# Patient Record
Sex: Male | Born: 1965 | Race: White | Hispanic: No | Marital: Married
Health system: Southern US, Community
[De-identification: ages and names within clinical notes are randomized; demographics above are authoritative.]

## PROBLEM LIST (undated history)

## (undated) HISTORY — PX: SHOULDER FUSION: SUR625

## (undated) HISTORY — PX: MASTOIDECTOMY: SHX711

---

## 2004-05-27 ENCOUNTER — Emergency Department: Payer: Self-pay | Admitting: Internal Medicine

## 2016-09-20 ENCOUNTER — Encounter: Payer: Self-pay | Admitting: *Deleted

## 2016-09-20 ENCOUNTER — Ambulatory Visit
Admission: EM | Admit: 2016-09-20 | Discharge: 2016-09-20 | Disposition: A | Discharge status: Home / Self Care | Payer: Managed Care, Other (non HMO) | Attending: Family Medicine | Admitting: Family Medicine

## 2016-09-20 DIAGNOSIS — M542 Cervicalgia: Secondary | ICD-10-CM | POA: Diagnosis not present

## 2016-09-20 MED ORDER — PREDNISONE 20 MG PO TABS: 20.0000 mg | ORAL_TABLET | Freq: Every day | ORAL | 0 refills | Status: DC

## 2016-09-20 MED ORDER — CYCLOBENZAPRINE HCL 10 MG PO TABS: 10.0000 mg | ORAL_TABLET | Freq: Every day | ORAL | 0 refills | Status: DC

## 2016-09-20 MED ORDER — HYDROCODONE-ACETAMINOPHEN 5-325 MG PO TABS: ORAL_TABLET | ORAL | 0 refills | Status: DC

## 2016-09-20 NOTE — ED Provider Notes (Signed)
[facility]    CSN: [id number] Arrival date & time: 09/20/16  1157     History   Chief Complaint Chief Complaint  Patient presents with  . Neck Injury    HPI Todd Lewis is a 51 y.o. male.   51 yo male with a c/o chronic neck pain which has recently worsened over the last week. States about 12 years ago he injured his neck, but was never evaluated for this. Since then has had intermittent neck pains which he's treated at home, however over the past week the pain has worsened and last Friday (5 days ago) he saw a chiropractor and was told he had degenerative disease. Patient c/o continuing pain on the right neck that radiates down his arm with occasional numbness/tingling down the arm.    The history is provided by the patient.  Neck Injury     History reviewed. No pertinent past medical history.  There are no active problems to display for this patient.   History reviewed. No pertinent surgical history.     Home Medications    Prior to Admission medications   Medication Sig Start Date End Date Taking? Authorizing Provider  cyclobenzaprine (FLEXERIL) 10 MG tablet Take 1 tablet (10 mg total) by mouth at bedtime. 09/20/16   Payton Mccallum, MD  HYDROcodone-acetaminophen (NORCO/VICODIN) 5-325 MG tablet 1-2 tabs po qhs prn 09/20/16   Payton Mccallum, MD  predniSONE (DELTASONE) 20 MG tablet Take 1 tablet (20 mg total) by mouth daily. 09/20/16   Payton Mccallum, MD    Family History History reviewed. No pertinent family history.  Social History Social History  Substance Use Topics  . Smoking status: Never Smoker  . Smokeless tobacco: Never Used  . Alcohol use No     Allergies   Patient has no known allergies.   Review of Systems Review of Systems   Physical Exam Triage Vital Signs ED Triage Vitals  Enc Vitals Group     BP 09/20/16 1241 (!) 155/102     Pulse Rate 09/20/16 1241 67     Resp 09/20/16 1241 16     Temp 09/20/16 1241 98.6 F (37 C)      Temp Source 09/20/16 1241 Oral     SpO2 09/20/16 1241 97 %     Weight 09/20/16 1243 230 lb (104.3 kg)     Height 09/20/16 1243 5\' 11"  (1.803 m)     Head Circumference --      Peak Flow --      Pain Score 09/20/16 1333 5     Pain Loc --      Pain Edu? --      Excl. in GC? --    No data found.   Updated Vital Signs BP (!) 155/102 (BP Location: Left Arm)   Pulse 67   Temp 98.6 F (37 C) (Oral)   Resp 16   Ht 5\' 11"  (1.803 m)   Wt 230 lb (104.3 kg)   SpO2 97%   BMI 32.08 kg/m   Visual Acuity Right Eye Distance:   Left Eye Distance:   Bilateral Distance:    Right Eye Near:   Left Eye Near:    Bilateral Near:     Physical Exam  Constitutional: He is oriented to person, place, and time. He appears well-developed and well-nourished. No distress.  Musculoskeletal:       Cervical back: He exhibits tenderness (over the cervical paraspinous muscles on the right and right trapezius muscle) and spasm.  He exhibits normal range of motion, no bony tenderness, no swelling, no edema, no deformity, no laceration and normal pulse.  Neurological: He is alert and oriented to person, place, and time. He displays normal reflexes. No cranial nerve deficit or sensory deficit. He exhibits normal muscle tone. Coordination normal.  Skin: He is not diaphoretic.  Nursing note and vitals reviewed.    UC Treatments / Results  Labs (all labs ordered are listed, but only abnormal results are displayed) Labs Reviewed - No data to display  EKG  EKG Interpretation None       Radiology No results found.  Procedures Procedures (including critical care time)  Medications Ordered in UC Medications - No data to display   Initial Impression / Assessment and Plan / UC Course  I have reviewed the triage vital signs and the nursing notes.  Pertinent labs & imaging results that were available during my care of the patient were reviewed by me and considered in my medical decision making (see  chart for details).       Final Clinical Impressions(s) / UC Diagnoses   Final diagnoses:  Neck pain    New Prescriptions Discharge Medication List as of 09/20/2016  1:31 PM    START taking these medications   Details  cyclobenzaprine (FLEXERIL) 10 MG tablet Take 1 tablet (10 mg total) by mouth at bedtime., Starting Tue 09/20/2016, Normal    HYDROcodone-acetaminophen (NORCO/VICODIN) 5-325 MG tablet 1-2 tabs po qhs prn, Print    predniSONE (DELTASONE) 20 MG tablet Take 1 tablet (20 mg total) by mouth daily., Starting Tue 09/20/2016, Normal       1. diagnosis reviewed with patient 2. rx as per orders above; reviewed possible side effects, interactions, risks and benefits  3. Recommend supportive treatment with heat/ice, gentle range of motion 4. Follow-up with PCP; discussed with patient if no improvement he may need referral for MRI 5. F/u  prn if symptoms worsen or don't improve   Payton Mccallumrlando Jeymi Hepp, MD 09/20/16 1359

## 2016-09-20 NOTE — ED Triage Notes (Signed)
Pt has had chronic intermittent neck pain which has become worse recently. Intermittent right arm numbness and neck crepitus. Pt seeing chiropractor which referred him here today.

## 2016-09-20 NOTE — Discharge Instructions (Signed)
Tylenol extra strength 2 tabs in the morning and every 8 hours as needed

## 2016-10-03 ENCOUNTER — Encounter: Payer: Self-pay | Admitting: *Deleted

## 2016-10-03 ENCOUNTER — Ambulatory Visit (INDEPENDENT_AMBULATORY_CARE_PROVIDER_SITE_OTHER): Payer: Managed Care, Other (non HMO)

## 2016-10-03 ENCOUNTER — Ambulatory Visit
Admission: EM | Admit: 2016-10-03 | Discharge: 2016-10-03 | Disposition: A | Discharge status: Home / Self Care | Payer: Managed Care, Other (non HMO) | Attending: Emergency Medicine | Admitting: Emergency Medicine

## 2016-10-03 DIAGNOSIS — M542 Cervicalgia: Secondary | ICD-10-CM

## 2016-10-03 DIAGNOSIS — M5412 Radiculopathy, cervical region: Secondary | ICD-10-CM

## 2016-10-03 MED ORDER — DICLOFENAC SODIUM 75 MG PO TBEC: 75.0000 mg | DELAYED_RELEASE_TABLET | Freq: Two times a day (BID) | ORAL | 0 refills | Status: DC

## 2016-10-03 MED ORDER — METHYLPREDNISOLONE 4 MG PO TBPK: ORAL_TABLET | ORAL | 0 refills | Status: DC

## 2016-10-03 MED ORDER — TIZANIDINE HCL 4 MG PO TABS: 4.0000 mg | ORAL_TABLET | Freq: Three times a day (TID) | ORAL | 0 refills | Status: DC | PRN

## 2016-10-03 NOTE — ED Provider Notes (Signed)
HPI  SUBJECTIVE:  Todd RouxRobert Awbrey is a 51 y.o. male who presents with right arm numbness, pins and needles paresthesias along the lateral posterior aspect of his arm radiating to his middle, index finger and thumb for the past 3 months. Patient states he has a painful knot underneath his shoulder blade. States the pain started in his mid neck, and this has gotten slightly better and is more located along the trapezius. Patient states he had plain films done in his chiropractor 2 weeks ago and showed degenerative changes C5-6 and 7. He has tried acupuncture, chiropractic therapy, Flexeril, Norco, aspirin, Aleve , and a sling. Symptoms are better with prednisone, lateral bending his neck to the left, and with a sling, worse with lateral bending his head to the right and with shoulder flexion and extension. Patient denies limitation of motion of the shoulder, elbow, wrist, hand, grip weakness, color or temperature changes. He states the pain is worse at night. No unintentional weight loss, fevers, thoracic or back pain. He has past medical history of right neck injury "years ago". He denies any recent trauma. 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 No history of diabetes, hypertension, cancer, HIV, IVDU, GI bleed, kidney disease. PMD: None.  Patient was seen at this clinic on 1/30 for the same, found to have right-sided trapezius and cervical paraspinous tenderness and muscle spasm, thought to have musculoskeletal cause of his symptoms and was sent home with Flexeril, Norco and prednisone 20 mg qd x 7 days.     History reviewed. No pertinent past medical history.  History reviewed. No pertinent surgical history.  History reviewed. No pertinent family history.  Social History  Substance Use Topics  . Smoking status: Never Smoker  . Smokeless tobacco: Never Used  . Alcohol use No    No current facility-administered medications for this encounter.    Current Outpatient Prescriptions:  .  diclofenac (VOLTAREN) 75 MG EC tablet, Take 1 tablet (75 mg total) by mouth 2 (two) times daily. Take with food, Disp: 30 tablet, Rfl: 0 .  methylPREDNISolone (MEDROL DOSEPAK) 4 MG TBPK tablet, follow package directions, Disp: 21 tablet, Rfl: 0 .  tiZANidine (ZANAFLEX) 4 MG tablet, Take 1 tablet (4 mg total) by mouth every 8 (eight) hours as needed for muscle spasms., Disp: 30 tablet, Rfl: 0  No Known Allergies   ROS  As noted in HPI.   Physical Exam  BP (!) 169/106 (BP Location: Left Arm)   Pulse 71   Temp 98.1 F (36.7 C) (Oral)   Resp 16   Ht 5\' 11"  (1.803 m)   Wt 230 lb (104.3 kg)   SpO2 98%   BMI 32.08 kg/m   Constitutional: Well developed, well nourished, no acute distress Eyes:  EOMI, conjunctiva normal bilaterally HENT: Normocephalic, atraumatic,mucus membranes moist Respiratory: Normal inspiratory effort Cardiovascular: Normal rate GI: nondistended skin: No rash, skin intact Musculoskeletal: Positive tenderness, muscle spasm right trapezius. No C-spine, T-spine tenderness. No other tenderness over the entire shoulder joint. Patient has full range of active motion in his shoulder, elbow, wrist. Paresthesias aggravated with NT can test and abduction past 90, but patient has no weakness with these tests. Lift off test negative. Grip strength 5/5 and equal bilaterally. He is neurovascularly intact to light touch, temperature and the median/radial/ulnar distribution. Neurologic: Alert & oriented x 3, no focal neuro deficits Psychiatric: Speech and behavior appropriate   ED Course   Medications - No data to display  Orders Placed This Encounter  Procedures  . DG Cervical Spine Complete    Standing Status:   Standing    Number of Occurrences:   1    Order Specific Question:   Reason for Exam (SYMPTOM  OR DIAGNOSIS REQUIRED)    Answer:   R neck shoudler pain, numbnes in middle, index and thumb r/o acute changes    No  results found for this or any previous visit (from the past 24 hour(s)). Dg Cervical Spine Complete  Result Date: 10/03/2016 CLINICAL DATA:  Pt here for numbness in right arm and pain in post midline lower neck and radiates into right shoulder. Pt injured neck 10 years ago in an accident when he was unloading cherry picker off back of truck. He was standing in basket of cherry picker as it was coming off truck and basket was not high enough so it slammed into the ground jarring his whole body. Pt has been self manipulating neck for years since to make his pain go away but has been worse x 3 months now. Some relief with acupuncture but still having pain and numbness. Degenerative changes in lower C-spine noted by chiropractor. EXAM: CERVICAL SPINE - COMPLETE 4+ VIEW COMPARISON:  None. FINDINGS: There is loss of cervical lordosis. This may be secondary to splinting, soft tissue injury, or positioning. There is disc height loss and osteophyte formation at C4-5 and C5-6. No acute fracture or traumatic subluxation. Prevertebral soft tissues have a normal appearance. Lung apices are clear. IMPRESSION: 1.  No evidence for acute  abnormality. 2. Mid cervical spondylosis. 3. Loss of cervical lordosis. Electronically Signed   By: Norva Pavlov M.D.   On: 10/03/2016 12:42    ED Clinical Impression  Cervical radiculopathy  Neck pain   ED Assessment/Plan  Previous records reviewed as noted in history of present illness.  [facility] narcotic database reviewed. 1 narcotic prescription Norco 5/325#10 dispensed 1/30 which was when he was seen here at this clinic.  Obtaining plain films today as he has no records of these being done in the system.  Imaging independently. Disc height loss and osteophyte C4-C5 and C5-C6. No acute fractures or subluxations. See radiology report for details.  Plan to send home on a Medrol Dosepak, start some diclofenac as he is not on any NSAIDs, discontinue Flexeril and try  Zanaflex instead. Patient states that Norco does not help any whatsoever so we'll not refill this. We'll provide referral to Dr. Adriana Simas, neurosurgeon on call and also to Dr. Martha Clan, orthopedic surgeon on call. Advised patient to try both physicians. Discussed with patient that his next step is an MRI of his C-spine. We'll also provide a primary care referral.  Discussed imaging, MDM, plan and followup with patient. Discussed sn/sx that should prompt return to the ED. Patient agrees with plan.   Meds ordered this encounter  Medications  . diclofenac (VOLTAREN) 75 MG EC tablet    Sig: Take 1 tablet (75 mg total) by mouth 2 (two) times daily. Take with food    Dispense:  30 tablet    Refill:  0  . tiZANidine (ZANAFLEX) 4 MG tablet    Sig: Take 1 tablet (4 mg total) by mouth every 8 (eight) hours as needed for muscle spasms.    Dispense:  30 tablet    Refill:  0  . methylPREDNISolone (MEDROL DOSEPAK) 4 MG TBPK tablet    Sig: follow package directions    Dispense:  21 tablet  Refill:  0    *This clinic note was created using Scientist, clinical (histocompatibility and immunogenetics). Therefore, there may be occasional mistakes despite careful proofreading.  ?   Domenick Gong, MD 10/03/16 1729

## 2016-10-03 NOTE — ED Triage Notes (Signed)
Recent hx of right neck, shoulder, and arm pain and paresthesias. Seen here recently. Pt returns today with increasing right neck, shoulder, and arm pain. States much worse today.

## 2016-10-03 NOTE — Discharge Instructions (Signed)
Follow-up with Dr. Adriana Simasook, neurosurgery or with Dr. Martha ClanKrasinski, orthopedic surgeon, whoever you can see first. Take the medicine as written. Go to the ER for arm weakness, worsening of your symptoms, fevers above 100.4 or for other concerns

## 2017-09-06 IMAGING — CR DG CERVICAL SPINE COMPLETE 4+V
7 series · 9 of 9 positions shown · non-contrast
Comparison: None.

CLINICAL DATA: Pt here for numbness in right arm and pain in post
midline lower neck and radiates into right shoulder. Pt injured neck
10 years ago in an accident when he was unloading [employer] off
back of truck. He was standing in basket of [employer] as it was
coming off truck and basket was not high enough so it slammed into
the ground jarring his whole body. Pt has been self manipulating
neck for years since to make his pain go away but has been worse x 3
months now. Some relief with acupuncture but still having pain and
numbness. Degenerative changes in lower C-spine noted by
chiropractor.

EXAM:
CERVICAL SPINE - COMPLETE 4+ VIEW

[c-spine lat]
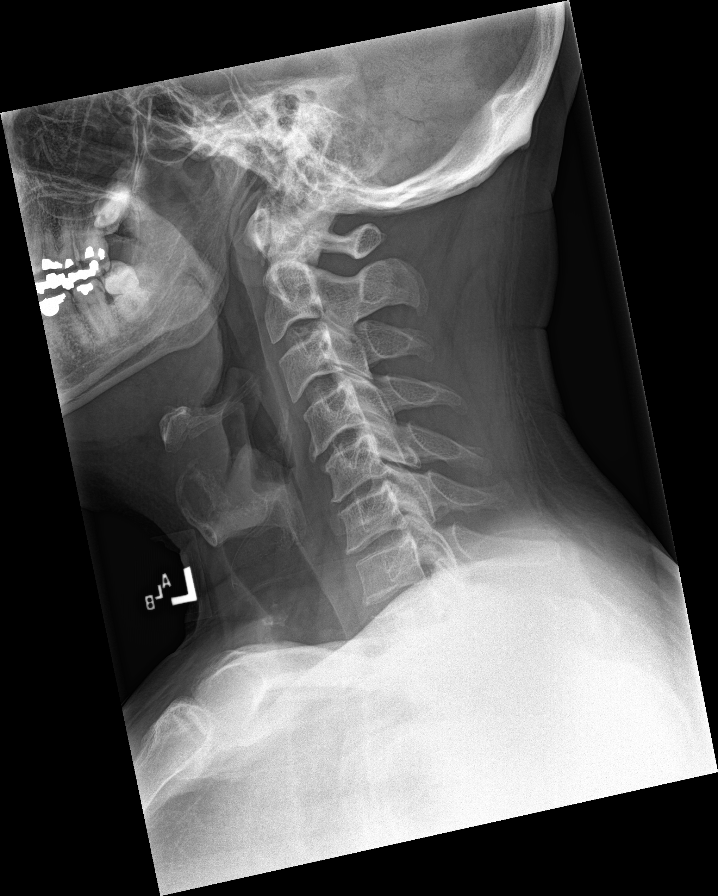

[Series 2: c-spine obl · 0.14mm/px · 2 of 2 slices shown (1 of 2)]
[im 1/2]
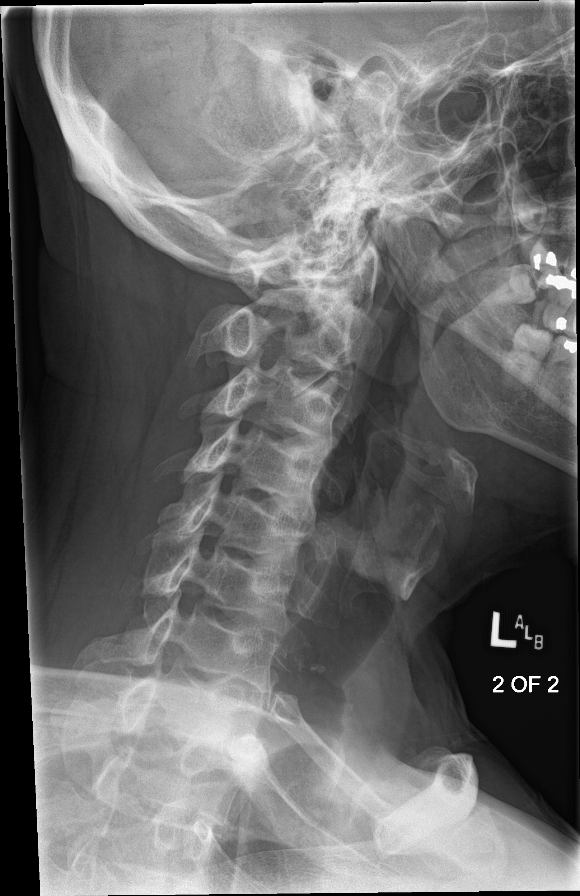
[im 2/2]
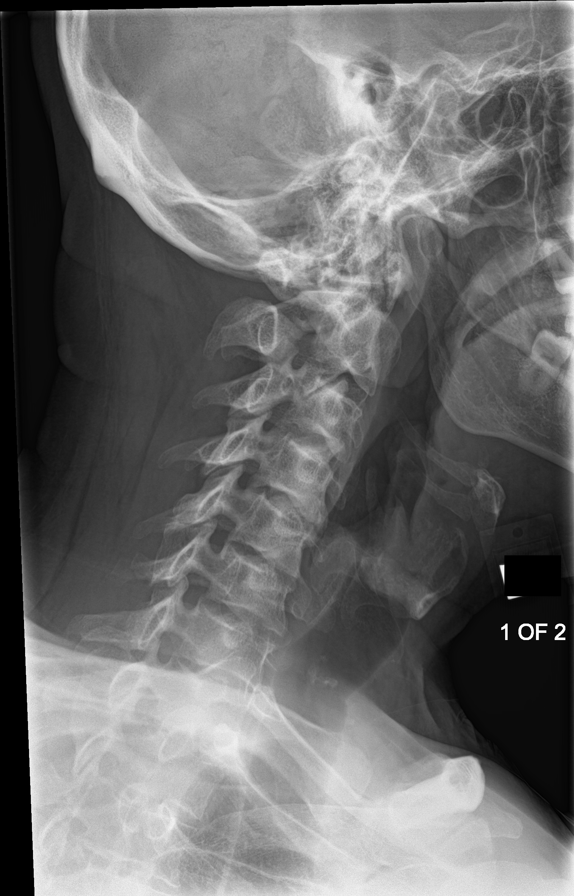

[c-spine obl (2 of 2)]
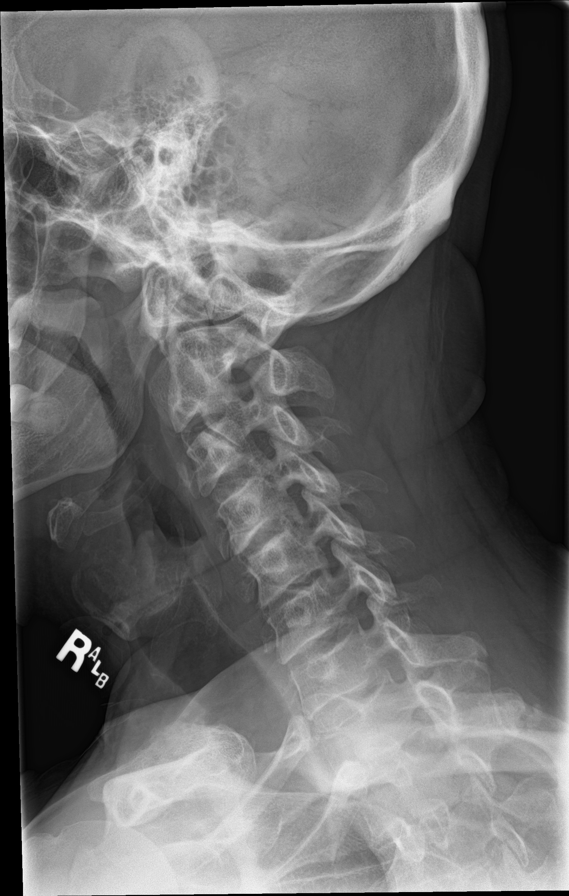

[c-spine ap]
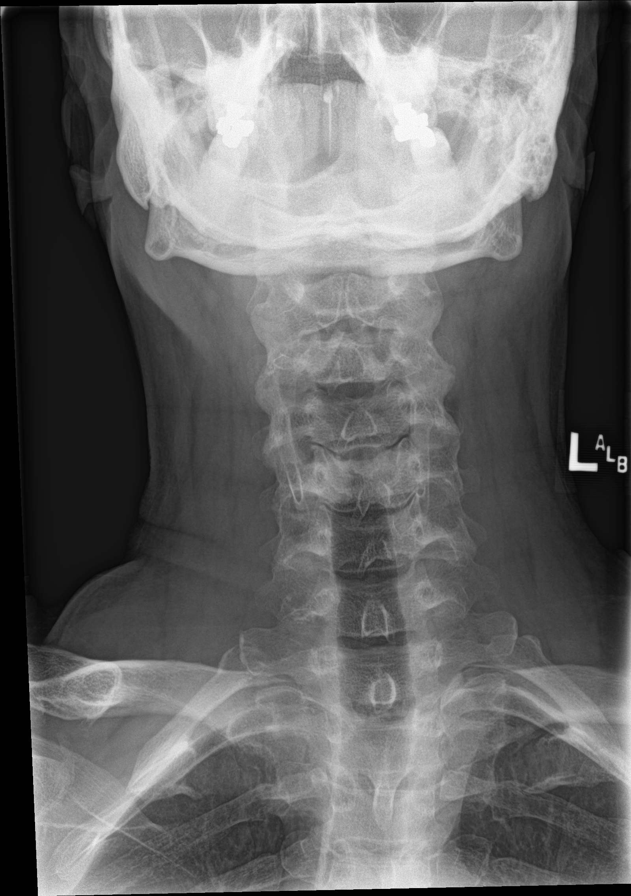

[Series 5: c-spine open mouth · 0.14mm/px · 2 of 2 slices shown]
[im 1/2]
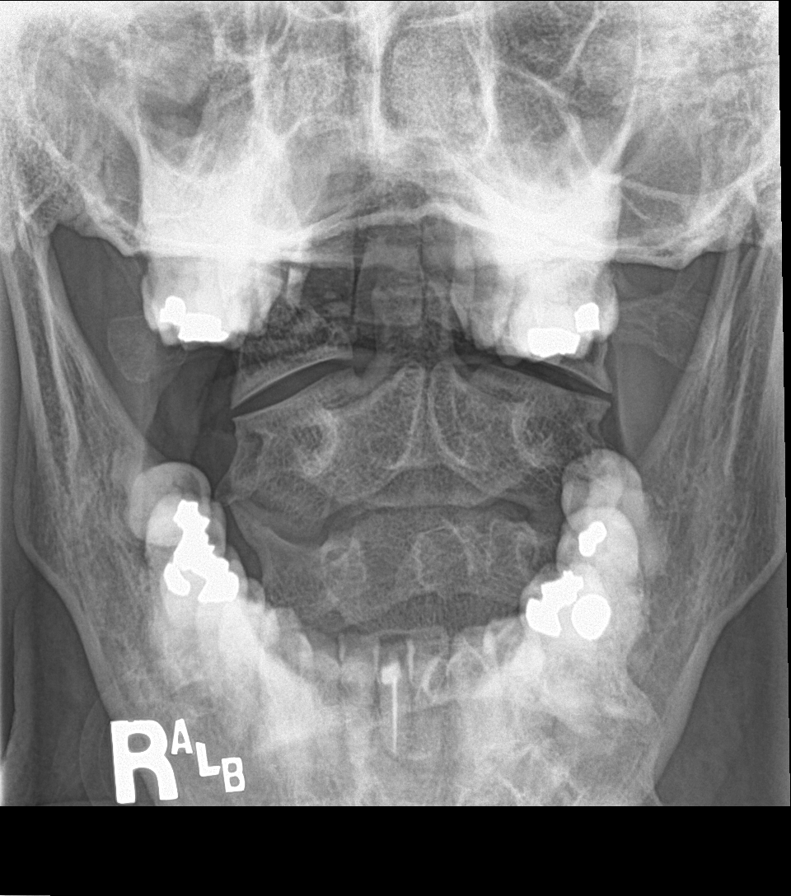
[im 2/2]
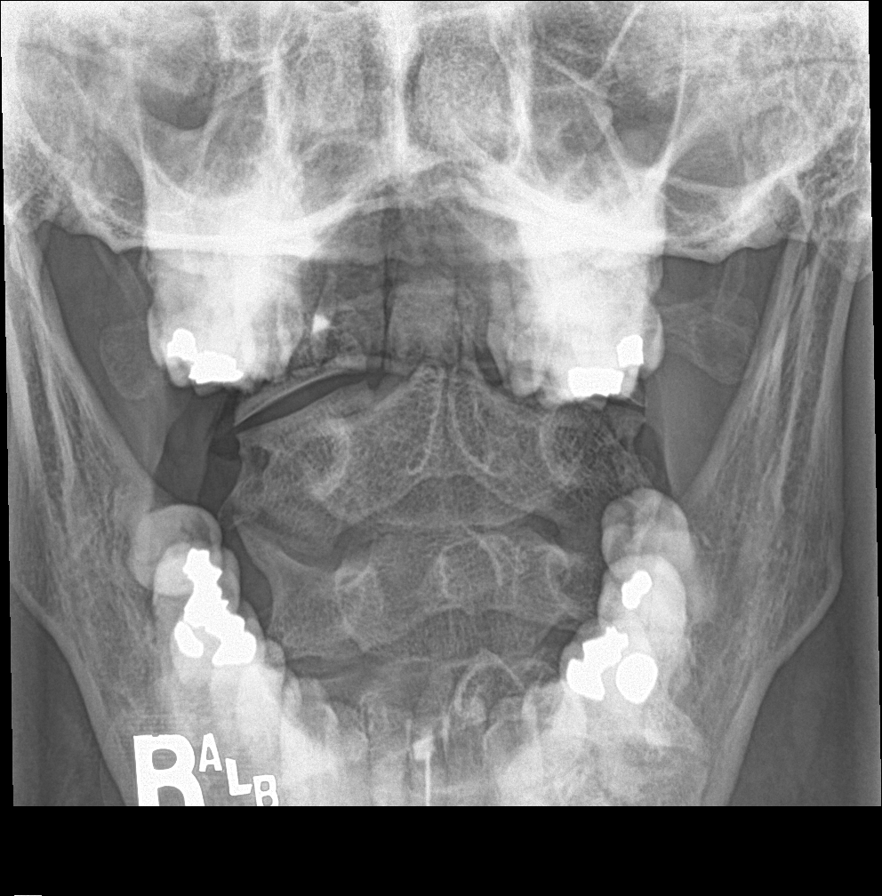

[[person_name]]
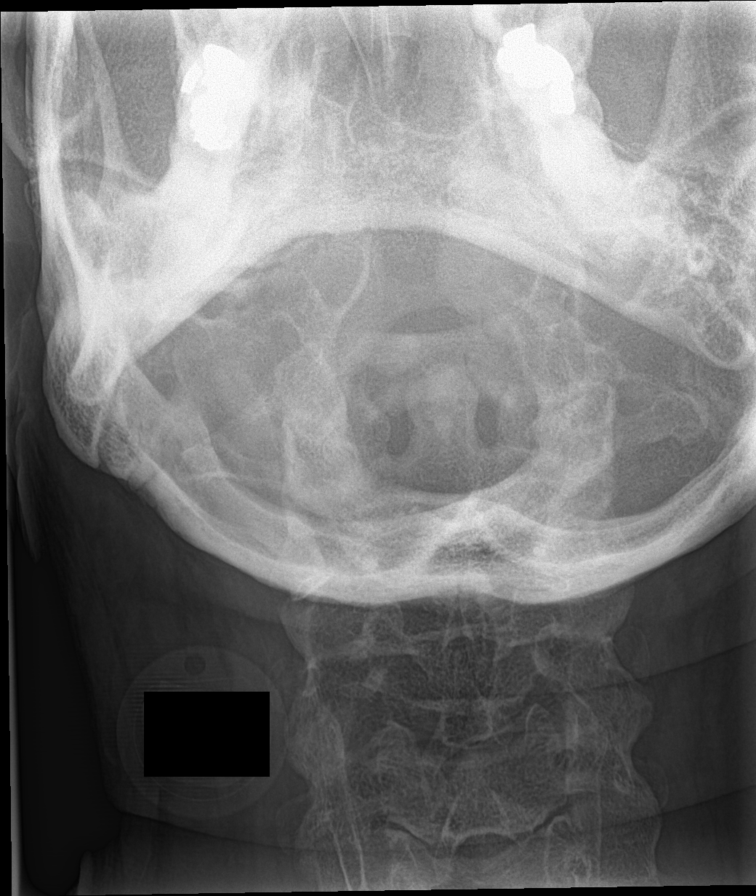

[ct-spine swimmers]
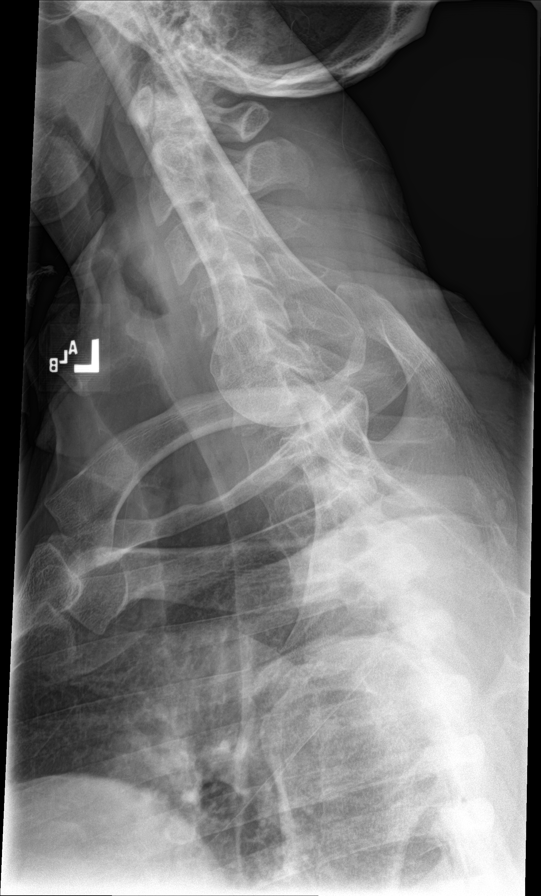

[9 of 9 positions shown; findings below may reference images not displayed]

FINDINGS: There is loss of cervical lordosis. This may be secondary to
splinting, soft tissue injury, or positioning. There is disc height
loss and osteophyte formation at C4-5 and C5-6. No acute fracture or
traumatic subluxation. Prevertebral soft tissues have a normal
appearance. Lung apices are clear.
IMPRESSION: 1.  No evidence for acute  abnormality.
2. Mid cervical spondylosis.
3. Loss of cervical lordosis.

## 2020-08-17 ENCOUNTER — Other Ambulatory Visit: Payer: Self-pay

## 2020-08-17 DIAGNOSIS — Z20822 Contact with and (suspected) exposure to covid-19: Secondary | ICD-10-CM

## 2020-08-18 LAB — NOVEL CORONAVIRUS, NAA: SARS-CoV-2, NAA: NOT DETECTED

## 2020-08-18 LAB — SARS-COV-2, NAA 2 DAY TAT

## 2024-05-31 ENCOUNTER — Ambulatory Visit (INDEPENDENT_AMBULATORY_CARE_PROVIDER_SITE_OTHER): Payer: Self-pay

## 2024-05-31 VITALS — BP 178/108 | HR 69 | Ht 71.0 in | Wt 230.8 lb

## 2024-05-31 DIAGNOSIS — G8929 Other chronic pain: Secondary | ICD-10-CM

## 2024-05-31 DIAGNOSIS — I10 Essential (primary) hypertension: Secondary | ICD-10-CM | POA: Insufficient documentation

## 2024-05-31 DIAGNOSIS — F1290 Cannabis use, unspecified, uncomplicated: Secondary | ICD-10-CM | POA: Diagnosis not present

## 2024-05-31 DIAGNOSIS — M545 Low back pain, unspecified: Secondary | ICD-10-CM

## 2024-05-31 DIAGNOSIS — Z0000 Encounter for general adult medical examination without abnormal findings: Secondary | ICD-10-CM | POA: Diagnosis not present

## 2024-05-31 DIAGNOSIS — K439 Ventral hernia without obstruction or gangrene: Secondary | ICD-10-CM | POA: Insufficient documentation

## 2024-05-31 DIAGNOSIS — M549 Dorsalgia, unspecified: Secondary | ICD-10-CM | POA: Insufficient documentation

## 2024-05-31 DIAGNOSIS — I158 Other secondary hypertension: Secondary | ICD-10-CM

## 2024-05-31 MED ORDER — CYCLOBENZAPRINE HCL 5 MG PO TABS: 5.0000 mg | ORAL_TABLET | Freq: Every day | ORAL | 1 refills | Status: AC

## 2024-05-31 NOTE — Progress Notes (Signed)
 New Patient Visit   Physician: Tyshon Fanning A Doug Bucklin, MD  Patient: Todd Lewis   DOB: 1966-05-31   58 y.o. Male  MRN: [id number] Visit Date: 05/31/2024   Chief Complaint  Patient presents with   Establish Care   Subjective  Todd Lewis is a 58 y.o. male who presents today as a new patient to establish care.   HPI  Discussed the use of AI scribe software for clinical note transcription with the patient, who gave verbal consent to proceed.  History of Present Illness   Todd Lewis is a 58 year old male who presents with a midline hernia and back pain.  Abdominal wall hernia - Midline hernia present for a number of years, painful at times - Hernia has been enlarging over time  Low back pain - Lower back pain present for the past couple of weeks - Pain is most severe in the mornings upon waking and he is unable to move in the mornings - Pain radiates across the lower back, overall severe - Pressure to specific points provides some relief - Aleve taken for pain, but decreasing effectiveness  Blood pressure elevation in clinical settings - Elevated blood pressure readings in medical settings - Blood pressure normalizes after a period of rest - No home blood pressure monitoring - No recent blood work - No chest pain or shortness of breath with exertion.   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   Encounter for general health examination  Yes    Orders Placed This Encounter  Procedures   CBC with Differential/Platelet   Comprehensive metabolic panel with GFR   Hemoglobin A1c   Lipid panel   Urinalysis, Routine w reflex microscopic   PSA   Lipoprotein A (LPA)    Assessment and Plan    Ventral hernia Enlarging midline ventral hernia with occasional pain and disfigurement, indicating need for surgical evaluation. - Refer to general surgery for evaluation and management.  Low back pain with possible lumbar radiculopathy Possible lumbar radiculopathy due to suspected disc herniation or vertebral issues. - Order lumbar spine x-ray to assess vertebrae and disc status. - Prescribe cyclobenzaprine  at bedtime to alleviate morning stiffness and pain.  Elevated blood pressure, possible hypertension Elevated blood pressure noted, possibly due to white coat syndrome. - Advise obtaining a home blood pressure cuff and recording ten values to assess true blood pressure status.  Cannabis use, daily Daily cannabis use for relaxation. Discussed potential lung and brain health risks associated with smoking cannabis.  General Health Maintenance No recent blood work or health screenings. Discussed importance of regular health maintenance screenings. - Order comprehensive blood work including kidney function, liver function, PSA, and diabetes screening. - Advise obtaining routine health screenings annually.     Thoracolumbar back pain Exam unremarkable but severeity high in regard to morning pain  - Will obtain thoracic/lumbar x-ray for initial imaging  - Cyclobenzaprine  at bedtime prn  F/u in 3-4 weeks to review labs    Objective  BP (!) 178/108   Pulse 69   Ht 5' 11 (1.803 m)   Wt 230 lb 12.8 oz (104.7 kg)   SpO2 96%   BMI 32.19 kg/m      Review of Systems  Constitutional:  Negative for chills, fever and weight loss.  Eyes:  Negative for blurred vision. h Respiratory:  Negative for cough and shortness of breath.    Cardiovascular:  Negative for chest pain and palpitations.  Skin:  Negative for rash.  Psychiatric/Behavioral:  Negative for depression. The patient is not nervous/anxious.      Physical Exam Neurological:     Gait: Gait is intact.    Physical Exam Vitals reviewed.  Constitutional:      Appearance: Normal appearance. Well-developed with normal weight.  HENT:     Head: Normocephalic and atraumatic.  Normal mucous membranes, no oral lesions Eyes:     Pupils: Pupils are equal, round, and reactive to light.  Neck:     Thyroid: No thyroid mass or thyromegaly.  Cardiovascular:     Rate and Rhythm: Normal rate and regular rhythm. Normal heart sounds. Normal peripheral pulses Pulmonary:     Normal breath sounds with normal effort Abdominal:   Abdomen is soft, without tenderness or noted hepatosplenomegaly.  Ventral hernia, soft, reducible, 5 x 4 inches roughly Musculoskeletal:        General: No swelling or edema. Spine Musculoskeletal Exam  Gait   Gait is normal.  Inspection   Thoracolumbar   Thoracolumbar inspection is normal.  Palpation   Thoracolumbar   Tenderness: none   Right     Masses: none   Left     Masses: none   Lymphadenopathy:     Cervical: No cervical adenopathy.  Skin:    General: Skin is warm and dry without noticeable rash. Neurological:     General: No focal deficit present.  Psychiatric:        Mood and Affect: Mood, behavior and cognition normal   No past medical history on file. No past surgical history on file. No family status information on file.   No family history on file. Social History   Socioeconomic History   Marital status: Married    Spouse name: Not on file   Number of children: Not on file   Years of education: Not on file   Highest education level: GED or equivalent  Occupational History   Not on file  Tobacco Use   Smoking status: Not on file   Smokeless tobacco: Never  Substance and Sexual Activity   Alcohol use:  No   Drug use: Not on file   Sexual activity: Not on file  Other Topics Concern   Not on file  Social History Narrative   ** Merged History Encounter **       Social Drivers of Health   Financial Resource Strain: Low Risk  (05/27/2024)   Overall Financial Resource Strain (CARDIA)    Difficulty of Paying Living Expenses: Not hard at all  Food Insecurity: No Food Insecurity (05/27/2024)   Hunger Vital Sign    Worried About Running Out of Food in the Last Year: Never true    Ran Out of Food in the Last Year: Never true  Transportation Needs: No Transportation Needs (05/27/2024)   PRAPARE - Administrator, Civil Service (Medical): No    Lack of Transportation (Non-Medical): No  Physical Activity: Sufficiently Active (05/27/2024)   Exercise Vital Sign    Days of  Exercise per Week: 3 days    Minutes of Exercise per Session: 60 min  Stress: No Stress Concern Present (05/27/2024)   [employer] of Occupational Health - Occupational Stress Questionnaire    Feeling of Stress: Not at all  Social Connections: Moderately Isolated (05/27/2024)   Social Connection and Isolation Panel    Frequency of Communication with Friends and Family: More than three times a week    Frequency of Social Gatherings with Friends and Family: Once a week    Attends Religious Services: Never    Database administrator or Organizations: No    Attends Engineer, structural: Not on file    Marital Status: Married   Outpatient Medications Prior to Visit  Medication Sig   diclofenac  (VOLTAREN ) 75 MG EC tablet Take 1 tablet (75 mg total) by mouth 2 (two) times daily. Take with food   methylPREDNISolone  (MEDROL  DOSEPAK) 4 MG TBPK tablet follow package directions   tiZANidine  (ZANAFLEX ) 4 MG tablet Take 1 tablet (4 mg total) by mouth every 8 (eight) hours as needed for muscle spasms.   No facility-administered medications prior to visit.   No Known Allergies   There is no immunization history  on file for this patient.  Health Maintenance  Topic Date Due   HIV Screening  Never done   Hepatitis C Screening  Never done   DTaP/Tdap/Td (1 - Tdap) Never done   Hepatitis B Vaccines 19-59 Average Risk (1 of 3 - 19+ 3-dose series) Never done   Colonoscopy  Never done   Pneumococcal Vaccine: 50+ Years (1 of 1 - PCV) Never done   Zoster Vaccines- Shingrix (1 of 2) Never done   Influenza Vaccine  03/22/2024   COVID-19 Vaccine (1 - 2025-26 season) Never done   HPV VACCINES  Aged Out   Meningococcal B Vaccine  Aged Out    Patient Care Team: Everlene Parris LABOR, MD as PCP - General (Family Medicine)  Depression Screen     No data to display           Parris LABOR Everlene, MD  [facility] [phone] (phone) [phone] (fax)  [facility]

## 2024-06-04 LAB — COMPREHENSIVE METABOLIC PANEL WITH GFR
AG Ratio: 2.2 (calc) (ref 1.0–2.5)
ALT: 23 U/L (ref 9–46)
AST: 18 U/L (ref 10–35)
Albumin: 5 g/dL (ref 3.6–5.1)
Alkaline phosphatase (APISO): 68 U/L (ref 35–144)
BUN: 12 mg/dL (ref 7–25)
CO2: 25 mmol/L (ref 20–32)
Calcium: 10.1 mg/dL (ref 8.6–10.3)
Chloride: 107 mmol/L (ref 98–110)
Creat: 0.8 mg/dL (ref 0.70–1.30)
Globulin: 2.3 g/dL (ref 1.9–3.7)
Glucose, Bld: 91 mg/dL (ref 65–99)
Potassium: 4.1 mmol/L (ref 3.5–5.3)
Sodium: 140 mmol/L (ref 135–146)
Total Bilirubin: 0.6 mg/dL (ref 0.2–1.2)
Total Protein: 7.3 g/dL (ref 6.1–8.1)
eGFR: 102 mL/min/1.73m2 (ref >=60)

## 2024-06-04 LAB — HEMOGLOBIN A1C
Hgb A1c MFr Bld: 5.5 % (ref <5.7)
Mean Plasma Glucose: 111 mg/dL
eAG (mmol/L): 6.2 mmol/L

## 2024-06-04 LAB — CBC WITH DIFFERENTIAL/PLATELET
Absolute Lymphocytes: 2022 {cells}/uL (ref 850–3900)
Absolute Monocytes: 553 {cells}/uL (ref 200–950)
Basophils Absolute: 71 {cells}/uL (ref 0–200)
Basophils Relative: 0.9 %
Eosinophils Absolute: 253 {cells}/uL (ref 15–500)
Eosinophils Relative: 3.2 %
HCT: 47.5 % (ref 38.5–50.0)
Hemoglobin: 16.3 g/dL (ref 13.2–17.1)
MCH: 31.7 pg (ref 27.0–33.0)
MCHC: 34.3 g/dL (ref 32.0–36.0)
MCV: 92.4 fL (ref 80.0–100.0)
MPV: 9.9 fL (ref 7.5–12.5)
Monocytes Relative: 7 %
Neutro Abs: 5001 {cells}/uL (ref 1500–7800)
Neutrophils Relative %: 63.3 %
Platelets: 332 Thousand/uL (ref 140–400)
RBC: 5.14 Million/uL (ref 4.20–5.80)
RDW: 12.3 % (ref 11.0–15.0)
Total Lymphocyte: 25.6 %
WBC: 7.9 Thousand/uL (ref 3.8–10.8)

## 2024-06-04 LAB — LIPID PANEL
Cholesterol: 207 mg/dL — ABNORMAL HIGH (ref <200)
HDL: 41 mg/dL (ref >=40)
LDL Cholesterol (Calc): 140 mg/dL — ABNORMAL HIGH
Non-HDL Cholesterol (Calc): 166 mg/dL — ABNORMAL HIGH (ref <130)
Total CHOL/HDL Ratio: 5 (calc) — ABNORMAL HIGH (ref <5.0)
Triglycerides: 136 mg/dL (ref <150)

## 2024-06-04 LAB — PSA: PSA: 0.44 ng/mL (ref <=4.00)

## 2024-06-04 LAB — LIPOPROTEIN A (LPA): Lipoprotein (a): 63 nmol/L (ref <75)

## 2024-06-06 ENCOUNTER — Ambulatory Visit: Admitting: General Surgery

## 2024-06-06 ENCOUNTER — Encounter: Payer: Self-pay | Admitting: General Surgery

## 2024-06-06 VITALS — BP 152/90 | HR 85 | Ht 70.0 in | Wt 228.0 lb

## 2024-06-06 DIAGNOSIS — K439 Ventral hernia without obstruction or gangrene: Secondary | ICD-10-CM

## 2024-06-06 DIAGNOSIS — M6208 Separation of muscle (nontraumatic), other site: Secondary | ICD-10-CM

## 2024-06-06 NOTE — Progress Notes (Signed)
 Patient ID: Todd Lewis, male   DOB: 12/13/1965, 58 y.o.   MRN: [id number] CC: Possible Hernia History of Present Illness Todd Lewis is a 58 y.o. male with with no significant past medical history as he has not been to the doctor in a long time who presents in consultation for possible hernia.  The patient reports that for a long time he has noticed a bulge in his abdomen.  He reports that is getting little bit bigger as surgery constipated.  He denies any obstructive symptoms and denies any overlying skin changes.  He has never had abdominal surgery.  He does report he used to lift a lot of weights.  Past Medical History No past medical history on file.  Does not go to the doctor a lot no significant past medical history  Past Surgical History:  Procedure Laterality Date   MASTOIDECTOMY     SHOULDER FUSION      No Known Allergies  Current Outpatient Medications  Medication Sig Dispense Refill   cyclobenzaprine  (FLEXERIL ) 5 MG tablet Take 1 tablet (5 mg total) by mouth at bedtime. 1-2 tablets at bedtime as needed 30 tablet 1   No current facility-administered medications for this visit.    Family History No family history on file.     Social History Social History   Tobacco Use   Smoking status: Never    Passive exposure: Past   Smokeless tobacco: Current    Types: Chew  Vaping Use   Vaping status: Never Used  Substance Use Topics   Alcohol use: Yes   Drug use: Yes    Types: Marijuana    Occasional marijuana use   ROS Full ROS of systems performed and is otherwise negative there than what is stated in the HPI  Physical Exam Blood pressure (!) 152/90, pulse 85, height 5' 10 (1.778 m), weight 228 lb (103.4 kg), SpO2 98%.  Alert and oriented x 3, normal work of breathing room air, regular rate and rhythm, abdomen is soft, obese, no surgical scars, he does have a midline bulge above his umbilicus running superior about 5 to 6 cm, it is difficult to tell if  there is actual fascial defect but I do feel like there may be some underlying herniation.  There is definitely a degree of diastases in his abdomen.   I have personally reviewed the patient's imaging and medical records.    Assessment    Patient with midline bulge.  On exam it is very difficult to determine whether this is just diastases or whether he actually has a true hernia.  For that reason I would like to get a CT abdomen and pelvis to assess the abdominal wall.  I did discuss with him that if there is a hernia then we could potentially repair it.  However, if it does not hernia it is likely diastases recti which I do not perform repair for.  We will see him again when his CT is done.  A total of 45 minutes was spent reviewing the patient's chart, performing history and physical and discussing treatment options with the patient  Jayson MALVA Endow 06/06/2024, 4:04 PM

## 2024-06-06 NOTE — Patient Instructions (Signed)
 We will get you schedule for a CT of the abdomen and pelvis to better look at your hernia.  We will have you follow up here after we get the results.    We have scheduled you for a CT Scan of your Abdomen and Pelvis with IV contrast. This has been scheduled at [facility] on 06/12/24. Please arrive there by 2:15 pm and enter in through the [facility]. If you need to reschedule your Scan, you may do so by calling [phone]. Please let us  know if you reschedule your scan as we have to get authorization from your insurance for this.

## 2024-06-12 ENCOUNTER — Ambulatory Visit: Admission: RE | Admit: 2024-06-12 | Source: Ambulatory Visit

## 2024-06-18 ENCOUNTER — Ambulatory Visit: Admitting: General Surgery

## 2024-07-01 ENCOUNTER — Ambulatory Visit

## 2024-07-01 ENCOUNTER — Telehealth: Payer: Self-pay

## 2024-07-01 NOTE — Telephone Encounter (Signed)
 error
